# Patient Record
Sex: Female | Born: 1945 | Race: White | Hispanic: No | Marital: Married | Smoking: Never smoker
Health system: Southern US, Community
[De-identification: ages and names within clinical notes are randomized; demographics above are authoritative.]

## PROBLEM LIST (undated history)

## (undated) DIAGNOSIS — E785 Hyperlipidemia, unspecified: Secondary | ICD-10-CM

## (undated) DIAGNOSIS — Z881 Allergy status to other antibiotic agents status: Secondary | ICD-10-CM

## (undated) DIAGNOSIS — R232 Flushing: Secondary | ICD-10-CM

## (undated) HISTORY — DX: Allergy status to other antibiotic agents: Z88.1

## (undated) HISTORY — PX: RIGHT OOPHORECTOMY: SHX2359

## (undated) HISTORY — DX: Hyperlipidemia, unspecified: E78.5

## (undated) HISTORY — DX: Flushing: R23.2

## (undated) HISTORY — PX: COLONOSCOPY W/ BIOPSIES: SHX1374

## (undated) HISTORY — PX: ABDOMINAL HYSTERECTOMY: SHX81

---

## 1999-12-12 ENCOUNTER — Other Ambulatory Visit: Admission: RE | Admit: 1999-12-12 | Discharge: 1999-12-12 | Payer: Self-pay | Admitting: Obstetrics and Gynecology

## 2000-12-14 ENCOUNTER — Other Ambulatory Visit: Admission: RE | Admit: 2000-12-14 | Discharge: 2000-12-14 | Payer: Self-pay | Admitting: Obstetrics and Gynecology

## 2001-12-21 ENCOUNTER — Other Ambulatory Visit: Admission: RE | Admit: 2001-12-21 | Discharge: 2001-12-21 | Payer: Self-pay | Admitting: Obstetrics and Gynecology

## 2003-06-27 ENCOUNTER — Other Ambulatory Visit: Admission: RE | Admit: 2003-06-27 | Discharge: 2003-06-27 | Payer: Self-pay | Admitting: Family Medicine

## 2004-07-09 ENCOUNTER — Other Ambulatory Visit: Admission: RE | Admit: 2004-07-09 | Discharge: 2004-07-09 | Payer: Self-pay | Admitting: Family Medicine

## 2005-07-29 ENCOUNTER — Other Ambulatory Visit: Admission: RE | Admit: 2005-07-29 | Discharge: 2005-07-29 | Payer: Self-pay | Admitting: Family Medicine

## 2006-09-08 ENCOUNTER — Other Ambulatory Visit: Admission: RE | Admit: 2006-09-08 | Discharge: 2006-09-08 | Payer: Self-pay | Admitting: Family Medicine

## 2007-09-28 ENCOUNTER — Ambulatory Visit: Payer: Self-pay | Admitting: Internal Medicine

## 2009-04-16 ENCOUNTER — Ambulatory Visit (HOSPITAL_COMMUNITY): Admission: RE | Admit: 2009-04-16 | Discharge: 2009-04-16 | Payer: Self-pay | Admitting: Family Medicine

## 2010-05-23 ENCOUNTER — Ambulatory Visit (HOSPITAL_COMMUNITY)
Admission: RE | Admit: 2010-05-23 | Discharge: 2010-05-23 | Payer: Self-pay | Source: Home / Self Care | Attending: Family Medicine | Admitting: Family Medicine

## 2010-09-19 ENCOUNTER — Encounter: Payer: Self-pay | Admitting: Family Medicine

## 2011-07-14 ENCOUNTER — Other Ambulatory Visit (HOSPITAL_COMMUNITY): Payer: Self-pay | Admitting: *Deleted

## 2011-07-15 ENCOUNTER — Encounter (HOSPITAL_COMMUNITY)
Admission: RE | Admit: 2011-07-15 | Discharge: 2011-07-15 | Disposition: A | Discharge status: Home / Self Care | Payer: Medicare Other | Source: Ambulatory Visit | Attending: Family Medicine | Admitting: Family Medicine

## 2011-07-15 DIAGNOSIS — M810 Age-related osteoporosis without current pathological fracture: Secondary | ICD-10-CM | POA: Insufficient documentation

## 2011-07-15 MED ORDER — ZOLEDRONIC ACID 5 MG/100ML IV SOLN
5.0000 mg | Freq: Once | INTRAVENOUS | Status: AC
  Administered 2011-07-15: 5 mg via INTRAVENOUS
  Filled 2011-07-15: qty 100

## 2012-07-20 ENCOUNTER — Other Ambulatory Visit (HOSPITAL_COMMUNITY): Payer: Self-pay | Admitting: *Deleted

## 2012-07-21 ENCOUNTER — Encounter (HOSPITAL_COMMUNITY)
Admission: RE | Admit: 2012-07-21 | Discharge: 2012-07-21 | Disposition: A | Discharge status: Home / Self Care | Payer: Medicare Other | Source: Ambulatory Visit | Attending: Family Medicine | Admitting: Family Medicine

## 2012-07-21 DIAGNOSIS — M810 Age-related osteoporosis without current pathological fracture: Secondary | ICD-10-CM | POA: Insufficient documentation

## 2012-07-21 MED ORDER — ZOLEDRONIC ACID 5 MG/100ML IV SOLN
INTRAVENOUS | Status: AC
  Administered 2012-07-21: 5 mg via INTRAVENOUS
  Filled 2012-07-21: qty 100

## 2012-07-21 MED ORDER — ZOLEDRONIC ACID 5 MG/100ML IV SOLN: 5.0000 mg | Freq: Once | INTRAVENOUS | Status: AC

## 2012-08-05 ENCOUNTER — Other Ambulatory Visit: Payer: Self-pay | Admitting: *Deleted

## 2012-08-05 DIAGNOSIS — M810 Age-related osteoporosis without current pathological fracture: Secondary | ICD-10-CM

## 2012-09-20 ENCOUNTER — Telehealth: Payer: Self-pay | Admitting: Family Medicine

## 2012-09-20 NOTE — Telephone Encounter (Signed)
appt made

## 2012-09-21 ENCOUNTER — Ambulatory Visit (INDEPENDENT_AMBULATORY_CARE_PROVIDER_SITE_OTHER): Payer: Medicare Other | Admitting: Family Medicine

## 2012-09-21 ENCOUNTER — Encounter: Payer: Self-pay | Admitting: Family Medicine

## 2012-09-21 VITALS — BP 120/68 | HR 64 | Temp 97.1°F | Ht 63.75 in | Wt 117.6 lb

## 2012-09-21 DIAGNOSIS — R21 Rash and other nonspecific skin eruption: Secondary | ICD-10-CM

## 2012-09-21 MED ORDER — METHYLPREDNISOLONE ACETATE 80 MG/ML IJ SUSP
60.0000 mg | Freq: Once | INTRAMUSCULAR | Status: AC
  Administered 2012-09-21: 60 mg via INTRAMUSCULAR

## 2012-09-21 MED ORDER — PREDNISONE 10 MG PO TABS: ORAL_TABLET | ORAL | Status: DC

## 2012-09-21 NOTE — Progress Notes (Signed)
  Subjective:    Patient ID: Alexandra Davenport, female    DOB: [birth year], 67 y.o.   MRN: [id number]  HPI Patient presents with an urticarial type rash that is pruritic. This rash is predominantly located on her shoulders her abdomen above the waistline and her buttocks. She's says that it started after she used  a new detergent, Charlies Soap.   Review of Systems  Skin: Positive for rash (x 2 weeks, itchy , shoulders,waist, buttocks).       Objective:   Physical Exam There is a flat pruritic rash both shoulders, waist line above the abdomen, and posterior buttocks and upper thighs.      Assessment & Plan:  1. Rash of entire body  - predniSONE (DELTASONE) 10 MG tablet; 1 tablet 4 times a day for 2 days,  1 tablet 3 times a day for 2 days,  1 tablet 2 times a day for 2 days, 1 tablet daily for 2 days  Dispense: 20 tablet; Refill: 0  - methylPREDNISolone acetate (DEPO-MEDROL) injection 60 mg; Inject 0.75 mLs (60 mg total) into the muscle once.  Patient Instructions  Avoid fabric softeners Continue to use Dreft or [place] detergent. Take meds as directed Use Benadryl 25 mg over-the-counter in the evening and at bedtime as needed for itching Return to clinic in 2-3 weeks and we will use cryotherapy on actinic keratosis on the chest

## 2012-09-21 NOTE — Patient Instructions (Signed)
Avoid fabric softeners Continue to use Dreft or [employer] detergent. Take meds as directed Use Benadryl 25 mg over-the-counter in the evening and at bedtime as needed for itching Return to clinic in 2-3 weeks and we will use cryotherapy on actinic keratosis on the chest

## 2012-10-04 ENCOUNTER — Telehealth: Payer: Self-pay | Admitting: Family Medicine

## 2012-10-04 NOTE — Telephone Encounter (Signed)
APP MADE ° °

## 2012-10-05 ENCOUNTER — Ambulatory Visit (INDEPENDENT_AMBULATORY_CARE_PROVIDER_SITE_OTHER): Payer: Medicare Other | Admitting: Family Medicine

## 2012-10-05 ENCOUNTER — Encounter: Payer: Self-pay | Admitting: Family Medicine

## 2012-10-05 VITALS — BP 134/87 | HR 101 | Temp 97.2°F | Ht 63.75 in | Wt 114.4 lb

## 2012-10-05 DIAGNOSIS — R21 Rash and other nonspecific skin eruption: Secondary | ICD-10-CM

## 2012-10-05 DIAGNOSIS — R634 Abnormal weight loss: Secondary | ICD-10-CM

## 2012-10-05 LAB — THYROID PANEL WITH TSH
Free Thyroxine Index: 2.8 (ref 1.0–3.9)
T3 Uptake: 34 % (ref 22.5–37.0)
T4, Total: 8.2 ug/dL (ref 5.0–12.5)
TSH: 2.326 u[IU]/mL (ref 0.350–4.500)

## 2012-10-05 NOTE — Progress Notes (Signed)
  Subjective:    Patient ID: Alexandra Davenport, female    DOB: 1945/05/23, 67 y.o.   MRN: [id number]  HPI Patient continues to have rash that comes and goes. She was seen recently and treated with Depo-Medrol and prednisone. She washed and rewash all of her clothing and linen with a different detergent. She also describes a 3 pound weight loss since the last visit. She is due to get her colonoscopy in 2 days but this may have to be delayed.   Review of Systems  Skin: Positive for rash (continued).       Objective:   Physical Exam Sparse rash over her most of the body upper thighs lower abdomen, wrists, arms. The skin is dry and erythematous. There are no weeping lesions.      Assessment & Plan:  Persistent atopic dermatitis --Dr. Irene Limbo was called and she will see him this morning when her husband sees him  Weight loss -We will repeat thyroid profile today  Patient Instructions  Keep appointment with Dr. Irene Limbo Continue antihistamine Hold pravachol for 2 weeks After seeing Dr. Irene Limbo discussed the timing of colonoscopy with Dr. Kinnie Scales

## 2012-10-05 NOTE — Patient Instructions (Signed)
Keep appointment with Dr. Irene Limbo Continue antihistamine Hold pravachol for 2 weeks After seeing Dr. Irene Limbo discussed the timing of colonoscopy with Dr. Kinnie Scales

## 2012-10-05 NOTE — Addendum Note (Signed)
Addended by: Orma Render F on: 10/05/2012 09:07 AM   Modules accepted: Orders

## 2012-10-08 ENCOUNTER — Telehealth: Payer: Self-pay | Admitting: Family Medicine

## 2012-10-08 NOTE — Telephone Encounter (Signed)
Pt aware of med.

## 2012-10-08 NOTE — Telephone Encounter (Signed)
Please advise 

## 2012-10-08 NOTE — Telephone Encounter (Signed)
Try adding some Allegra over-the-counter one twice daily and continue the Benadryl at bedtime

## 2013-01-05 ENCOUNTER — Encounter: Payer: Self-pay | Admitting: Pharmacist

## 2013-01-05 ENCOUNTER — Ambulatory Visit (INDEPENDENT_AMBULATORY_CARE_PROVIDER_SITE_OTHER): Payer: Medicare Other | Admitting: Pharmacist

## 2013-01-05 ENCOUNTER — Ambulatory Visit (INDEPENDENT_AMBULATORY_CARE_PROVIDER_SITE_OTHER): Payer: Medicare Other

## 2013-01-05 ENCOUNTER — Telehealth: Payer: Self-pay | Admitting: Pharmacist

## 2013-01-05 VITALS — Ht 63.75 in | Wt 112.0 lb

## 2013-01-05 DIAGNOSIS — M810 Age-related osteoporosis without current pathological fracture: Secondary | ICD-10-CM

## 2013-01-05 NOTE — Progress Notes (Signed)
Patient ID: Alexandra Davenport, female   DOB: March 27, 1946, 67 y.o.   MRN: [id number] [facility] Current Height: Height: 5' 3.75" (161.9 cm)      Max Lifetime Height:  5\' 5"  Current Weight: Weight: 112 lb (50.803 kg)       Ethnicity:Caucasian    HPI: Does pt already have a diagnosis of:  Osteoporosis?  Yes  Back Pain?  No       Kyphosis?  No Prior fracture?  No Med(s) for Osteoporosis/Osteopenia:  reclast infused one a year - started 01/2007.  Last infusion was 07/21/2012. Med(s) previously tried for Osteoporosis/Osteopenia:  Fosamax - tolerated but treatment failed to maintain BMD.   Took Forteo for 2 years with great results.                                                             PMH: Age at menopause:  67 yo Hysterectomy?  Yes Oophorectomy?  1 ovary removed, 1 ovary remains HRT? No Steroid Use?  No Thyroid med?  No History of cancer?  No History of digestive disorders (ie Crohn's)?  No Current or previous eating disorders?  No Last Vitamin D Result:  81 (07/28/2012) Last GFR Result:  77 (07/28/2012)   FH/SH: Family history of osteoporosis?  Yes - mother, sister and aunt Parent with history of hip fracture?  No Family history of breast cancer?  No Exercise?  Yes   Smoking?  No Alcohol?  No    Calcium Assessment Calcium Intake  # of servings/day  Calcium mg  Milk (8 oz) 0  x  300  = 0  Yogurt (4 oz) 0 x  200 = 0  Cheese (1 oz) 0 x  200 = 0  Other Calcium sources   250mg   Ca supplement MVI and Calcium daily = 1000mg    Estimated calcium intake per day 1250mg     DEXA Results Date of Test T-Score for AP Spine L1-L4 T-Score for Total Left Hip T-Score for Total Right Hip  01/05/2013 -2.4 -2.5 -2.4  01/01/2011 -2.0 -2.5 -2.5  12/20/2008 -1.9 -2.5 -2.2  12/07/2006 -2.7 -2.6 -2.5   Assessment: Osteoporosis - decreased BMD in spine but stable in both hips  Recommendations: 1.  Continue Reclast IV yearly - next due 07/2013 2.  recommend calcium 1200mg  daily  through supplementation or diet.  Patient to separate MVI and Calcium supplement to ensure full absorption and benefit of each. 3.  continue weight bearing exercise - 30 minutes at least 4 days per week.   4.  Counseled and educated about fall risk and prevention.  Recheck DEXA:  2 years  Time spent counseling patient:  30 minutes

## 2013-01-05 NOTE — Telephone Encounter (Signed)
Patient aware of xray results.   

## 2013-01-05 NOTE — Telephone Encounter (Signed)
Message copied by Henrene Pastor on Wed Jan 05, 2013  1:53 PM ------      Message from: Ernestina Penna      Created: Wed Jan 05, 2013  1:36 PM       As per her report ------

## 2013-01-21 ENCOUNTER — Other Ambulatory Visit: Payer: Self-pay | Admitting: Family Medicine

## 2013-01-22 ENCOUNTER — Other Ambulatory Visit: Payer: Self-pay | Admitting: *Deleted

## 2013-01-22 MED ORDER — PRAVASTATIN SODIUM 80 MG PO TABS: 80.0000 mg | ORAL_TABLET | Freq: Every day | ORAL | Status: DC

## 2013-01-31 ENCOUNTER — Encounter: Payer: Self-pay | Admitting: Family Medicine

## 2013-01-31 ENCOUNTER — Ambulatory Visit (INDEPENDENT_AMBULATORY_CARE_PROVIDER_SITE_OTHER): Payer: Medicare Other | Admitting: Family Medicine

## 2013-01-31 VITALS — BP 106/64 | HR 67 | Temp 96.8°F | Ht 63.75 in | Wt 113.2 lb

## 2013-01-31 DIAGNOSIS — M810 Age-related osteoporosis without current pathological fracture: Secondary | ICD-10-CM

## 2013-01-31 DIAGNOSIS — E785 Hyperlipidemia, unspecified: Secondary | ICD-10-CM | POA: Insufficient documentation

## 2013-01-31 DIAGNOSIS — E559 Vitamin D deficiency, unspecified: Secondary | ICD-10-CM

## 2013-01-31 MED ORDER — PRAVASTATIN SODIUM 80 MG PO TABS: 80.0000 mg | ORAL_TABLET | Freq: Every day | ORAL | Status: DC

## 2013-01-31 NOTE — Patient Instructions (Addendum)
Fall precautions discussed Continue current meds and therapeutic lifestyle changes Return to clinic in October for a flu shot Bone density test is due Patient will be scheduled for a pelvic exam with Dr. Debroah Loop

## 2013-01-31 NOTE — Progress Notes (Signed)
  Subjective:    Patient ID: Alexandra Davenport, female    DOB: 1946/02/02, 67 y.o.   MRN: [id number]  HPI Patient comes in today for followup and management of chronic medical problems. These include lipidemia, osteoporosis, and vitamin D deficiency. She also complains of some urinary frequency and low back pain. However these complaints are unchanged from the past.  Her health maintenance prematures are up-to-date except for getting the flu shot this year. Her last pelvic exam was September 2013.    Review of Systems  Constitutional: Negative for activity change, appetite change and fatigue.  HENT: Negative for ear pain, congestion, sore throat, sneezing and postnasal drip.   Eyes: Negative.  Negative for discharge, redness, itching and visual disturbance.  Respiratory: Negative for cough, chest tightness, shortness of breath and wheezing.   Cardiovascular: Negative.  Negative for chest pain, palpitations and leg swelling.  Gastrointestinal: Negative for nausea, vomiting, abdominal pain, diarrhea, constipation and blood in stool.  Endocrine: Negative.  Negative for cold intolerance, heat intolerance, polydipsia, polyphagia and polyuria.  Genitourinary: Positive for frequency. Negative for dysuria, hematuria, vaginal bleeding and vaginal discharge.  Musculoskeletal: Positive for back pain (LBP occasional).  Skin: Positive for color change (mid upper chest, age spots). Negative for rash and wound.  Allergic/Immunologic: Negative.  Negative for environmental allergies and food allergies.  Neurological: Positive for light-headedness (intermitent). Negative for dizziness, tremors, weakness and headaches.  Psychiatric/Behavioral: Negative for confusion, sleep disturbance, decreased concentration and agitation. The patient is not nervous/anxious.        Objective:   Physical Exam BP 106/64  Pulse 67  Temp(Src) 96.8 F (36 C) (Oral)  Ht 5' 3.75" (1.619 m)  Wt 113 lb 3.2 oz (51.347 kg)  BMI 19.59  kg/m2  The patient appeared well nourished and normally developed, alert and oriented to time and place. Speech, behavior and judgement appear normal. Vital signs as documented.  Head exam is unremarkable. No scleral icterus or pallor noted. There is some nasal congestion bilaterally. Mouth and throat and ears were normal.  Neck is without jugular venous distension, thyromegally, or carotid bruits. Carotid upstrokes are brisk bilaterally. No cervical adenopathy. Lungs are clear anteriorly and posteriorly to auscultation. Normal respiratory effort. Cardiac exam reveals regular rate and rhythm at 60 per minute. First and second heart sounds normal.  No murmurs, rubs or gallops.  Abdominal exam reveals normal bowl sounds, no masses, no organomegaly and no aortic enlargement. No inguinal adenopathy. Extremities are nonedematous and both femoral and pedal pulses are normal. Skin without pallor or jaundice.  Warm and dry, without rash. There were many seborrheic keratoses these were flat and did not look suspicious in nature. Neurologic exam reveals normal deep tendon reflexes and normal sensation.          Assessment & Plan:  1. Hyperlipemia -Patient will return to clinic for retained lab work included lipids  2. Osteoporosis -DEXA scan is due now  3. Vitamin D deficiency -Continue current treatment  4. Pelvic exam is due  Patient to return to clinic for lab work Patient Instructions  Fall precautions discussed Continue current meds and therapeutic lifestyle changes Return to clinic in October for a flu shot Bone density test is due Patient will be scheduled for a pelvic exam with Dr. Christian Mate MD

## 2013-02-02 ENCOUNTER — Other Ambulatory Visit: Payer: Self-pay | Admitting: *Deleted

## 2013-02-02 DIAGNOSIS — Z01419 Encounter for gynecological examination (general) (routine) without abnormal findings: Secondary | ICD-10-CM

## 2013-02-17 ENCOUNTER — Other Ambulatory Visit (INDEPENDENT_AMBULATORY_CARE_PROVIDER_SITE_OTHER): Payer: Medicare Other

## 2013-02-17 DIAGNOSIS — Z23 Encounter for immunization: Secondary | ICD-10-CM

## 2013-02-17 DIAGNOSIS — E559 Vitamin D deficiency, unspecified: Secondary | ICD-10-CM

## 2013-02-17 DIAGNOSIS — E785 Hyperlipidemia, unspecified: Secondary | ICD-10-CM

## 2013-02-17 DIAGNOSIS — I10 Essential (primary) hypertension: Secondary | ICD-10-CM

## 2013-02-17 LAB — POCT CBC
Lymph, poc: 1.4 (ref 0.6–3.4)
MCH, POC: 29.6 pg (ref 27–31.2)
MCHC: 33.2 g/dL (ref 31.8–35.4)
MPV: 8.5 fL (ref 0–99.8)
POC LYMPH PERCENT: 33.1 %L (ref 10–50)
Platelet Count, POC: 172 10*3/uL (ref 142–424)
WBC: 4.3 10*3/uL — AB (ref 4.6–10.2)

## 2013-02-17 NOTE — Progress Notes (Unsigned)
Pt came in for labs only 

## 2013-02-19 LAB — NMR, LIPOPROFILE
HDL Cholesterol by NMR: 68 mg/dL (ref >=40)
LP-IR Score: <25 (ref <=45)
Small LDL Particle Number: <90 nmol/L (ref <=527)

## 2013-02-19 LAB — VITAMIN D 25 HYDROXY (VIT D DEFICIENCY, FRACTURES): Vit D, 25-Hydroxy: 67.5 ng/mL (ref 30.0–100.0)

## 2013-02-19 LAB — BMP8+EGFR
Calcium: 9.7 mg/dL (ref 8.6–10.2)
Creatinine, Ser: 0.73 mg/dL (ref 0.57–1.00)
GFR calc non Af Amer: 85 mL/min/{1.73_m2} (ref >59)
Glucose: 76 mg/dL (ref 65–99)
Potassium: 4.2 mmol/L (ref 3.5–5.2)

## 2013-02-19 LAB — HEPATIC FUNCTION PANEL
ALT: 27 IU/L (ref 0–32)
Bilirubin, Direct: 0.15 mg/dL (ref 0.00–0.40)

## 2013-03-01 ENCOUNTER — Encounter: Payer: Self-pay | Admitting: Obstetrics & Gynecology

## 2013-03-01 ENCOUNTER — Ambulatory Visit (INDEPENDENT_AMBULATORY_CARE_PROVIDER_SITE_OTHER): Payer: Medicare Other | Admitting: Obstetrics & Gynecology

## 2013-03-01 VITALS — BP 119/75 | HR 73 | Resp 16 | Ht 64.0 in | Wt 113.0 lb

## 2013-03-01 DIAGNOSIS — Z01419 Encounter for gynecological examination (general) (routine) without abnormal findings: Secondary | ICD-10-CM | POA: Insufficient documentation

## 2013-03-01 NOTE — Patient Instructions (Signed)
Breast Self-Examination You should begin examining your breasts at age 68 even though the risk for breast cancer is low in this age group. It is important to become familiar with how your breasts look and feel. This is true for pregnant women, nursing mothers, women in menopause and women who have breast implants.  Women should examine their breasts once a month to look for changes and lumps. By doing monthly breast exams, you get to know how your breasts feel and how they can change from month to month. This allows you to pick up changes early. It can also offer you some reassurance that your breast health is good. This exam only takes minutes. Most breast lumps are not caused by cancer. If you find a lump, a special x-ray called a mammogram, or other tests may be needed to determine what is wrong.  Some of the signs that a breast lump is caused by cancer include:  Dimpling of the skin or changes in the shape of the breast or nipple.   A dark-colored or bloody discharge from the nipple.   Swollen lymph glands around the breast or in the armpit.   Redness of the breast or nipple.   Scaly nipple or skin on the breast.   Pain or swelling of the breast.  SELF-EXAM There are a few points to follow when doing a thorough breast exam. The best time to examine your breasts is 5 to 7 days after the menstrual period is over. During menstruation, the breasts are lumpier, and it may be more difficult to pick up changes. If you do not menstruate, have reached menopause or had a hysterectomy, examine your breasts the first day of every month. After three to four months, you will become more familiar with the variations of your breasts and more comfortable with the exam.  Perform your breast exam monthly. Keep a written record with breast changes or normal findings for each breast. This makes it easier to be sure of changes and to not solely depend on memory for size, tenderness, or location. Try to do the exam  at the same time each month, and write down where you are in your menstrual cycle if you are still menstruating.   Look at your breasts. Stand in front of a mirror with your hands clasped behind your head. Tighten your chest muscles and look for asymmetry. This means a difference in shape or contour from one breast to the other, such as puckers, dips or bumps. Look also for skin changes.   Lean forward with your hands on your hips. Again, look for symmetry and skin changes.   While showering, soap the breasts, and carefully feel the breasts with fingertips while holding the arm (on the side of the breast being examined) over the head. Do this with each breast carefully feeling for lumps or changes. Typically, a circular motion with moderate fingertip pressure should be used.   Repeat this exam while lying on your back, again with your arm behind your head and a pillow under your shoulders. Again, use your fingertips to examine both breasts, feeling for lumps and thickening. Begin at 1 o'clock and go clockwise around the whole breast.   At the end of your exam, gently squeeze each nipple to see if there is any drainage. Look for nipple changes, dimpling or redness.   Lastly, examine the upper chest and clavicle areas and in your armpits.  It is not necessary to become alarmed if you find   a breast lump. Most of them are not cancerous. However, it is necessary to see your caregiver if a lump is found in order to have it looked at. Document Released: 06/12/2004 Document Revised: 01/15/2011 Document Reviewed: 08/22/2008 Exit[employer] Patient Information 2012 [employer]. 

## 2013-03-01 NOTE — Progress Notes (Signed)
Patient ID: Alexandra Davenport, female   DOB: 1946/02/15, 67 y.o.   MRN: [id number]  Chief Complaint  Patient presents with  . Gynecologic Exam    HPI Alexandra Davenport is a 67 y.o. female.  [id number] S/P TAH for class 3 pap age 54, normal paps since then. Later had a right oophorectomy. Referred for Gyn exam. No pelvic complaints, sees a urologist yearly HPI  Past Medical History  Diagnosis Date  . Hemorrhoids   . Osteoporosis   . 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 Endometriosis   . Hyperlipidemia   . Vasomotor flushing     Past Surgical History  Procedure Laterality Date  . Abdominal hysterectomy N/A   . Right oophorectomy Right   . Colonoscopy w/ biopsies N/A     No family history on file.  Social History History  Substance Use Topics  . Smoking status: Never Smoker   . Smokeless tobacco: Not on file  . Alcohol Use: Not on file    Allergies  Allergen Reactions  . Ezetimibe-Simvastatin Other (See Comments)    Muscle soreness    Current Outpatient Prescriptions  Medication Sig Dispense Refill  . aspirin (ASPIRIN LOW DOSE) 81 MG EC tablet Take 81 mg by mouth daily.        . Calcium Carbonate-Vitamin D (CALCIUM + D PO) Take 1 tablet by mouth daily.       . Cholecalciferol (VITAMIN D3) 1000 UNITS CAPS Take 1 capsule by mouth every other day.       . Coenzyme Q10 (CO Q-10) 200 MG CAPS Take by mouth daily.        . fish oil-omega-3 fatty acids 1000 MG capsule Take 2 g by mouth daily.      . Multiple Vitamin (MULTIVITAMIN) capsule Take 1 capsule by mouth daily.        Marland Kitchen oxybutynin (DITROPAN) 5 MG tablet Take 5 mg by mouth.        . pravastatin (PRAVACHOL) 80 MG tablet Take 1 tablet (80 mg total) by mouth daily.  90 tablet  3  . TRIMETHOPRIM PO Take 1 tablet by mouth daily as needed. Dr. Annabell Howells      . zoledronic acid (RECLAST) 5 MG/100ML SOLN injection Inject 5 mg into the vein once.       No current facility-administered medications for this visit.    Review of Systems Review of Systems   Constitutional: Negative for fever.  Gastrointestinal: Negative for abdominal pain.  Genitourinary: Positive for frequency. Negative for vaginal bleeding, vaginal discharge, pelvic pain and dyspareunia.    Blood pressure 119/75, pulse 73, resp. rate 16, height 5\' 4"  (1.626 m), weight 113 lb (51.256 kg).  Physical Exam Physical Exam  Nursing note and vitals reviewed. Constitutional: She is oriented to person, place, and time. She appears well-developed and well-nourished. No distress.  HENT:  Head: Normocephalic.  Pulmonary/Chest: Effort normal. No respiratory distress.  Breasts no mass or tenderness  Abdominal: Soft. She exhibits no mass. There is no tenderness.  Genitourinary:  Deferred, no indication today.  Neurological: She is alert and oriented to person, place, and time.  Skin: Skin is warm and dry.  Psychiatric: She has a normal mood and affect. Her behavior is normal.    Data Reviewed Office note  Assessment    Normal postmenopause female, age 19, S/P TAH      Plan    No need for routine pelvic exam, should have yearly breast exam,   Mammogram, colonoscopy on appropriate schedule.  Alexandra Davenport 03/01/2013, 10:58 AM

## 2013-07-04 ENCOUNTER — Telehealth: Payer: Self-pay | Admitting: Pharmacist

## 2013-07-04 DIAGNOSIS — Z79899 Other long term (current) drug therapy: Secondary | ICD-10-CM

## 2013-07-04 DIAGNOSIS — M810 Age-related osteoporosis without current pathological fracture: Secondary | ICD-10-CM

## 2013-07-04 NOTE — Telephone Encounter (Signed)
Yearly reclast infusion is due at beginning of March.  Will need labs prior - BMET.  Lab standing order made and patient notified to come in for labs in the next 2 weeks. After labs received can then schedule [facility].

## 2013-07-13 ENCOUNTER — Other Ambulatory Visit (INDEPENDENT_AMBULATORY_CARE_PROVIDER_SITE_OTHER): Payer: Medicare Other

## 2013-07-13 DIAGNOSIS — M810 Age-related osteoporosis without current pathological fracture: Secondary | ICD-10-CM

## 2013-07-13 DIAGNOSIS — Z79899 Other long term (current) drug therapy: Secondary | ICD-10-CM

## 2013-07-13 NOTE — Progress Notes (Signed)
PT CAME IN FOR LABS ONLY 

## 2013-07-14 ENCOUNTER — Encounter: Payer: Self-pay | Admitting: Pharmacist

## 2013-07-14 LAB — BMP8+EGFR
BUN/Creatinine Ratio: 25 (ref 11–26)
BUN: 17 mg/dL (ref 8–27)
CALCIUM: 9.8 mg/dL (ref 8.7–10.3)
CHLORIDE: 103 mmol/L (ref 97–108)
CO2: 27 mmol/L (ref 18–29)
Creatinine, Ser: 0.69 mg/dL (ref 0.57–1.00)
GFR calc Af Amer: 103 mL/min/{1.73_m2} (ref >59)
GFR calc non Af Amer: 90 mL/min/{1.73_m2} (ref >59)
GLUCOSE: 70 mg/dL (ref 65–99)
Potassium: 4 mmol/L (ref 3.5–5.2)
Sodium: 144 mmol/L (ref 134–144)

## 2013-07-26 ENCOUNTER — Other Ambulatory Visit (HOSPITAL_COMMUNITY): Payer: Self-pay | Admitting: *Deleted

## 2013-07-27 ENCOUNTER — Encounter (HOSPITAL_COMMUNITY)
Admission: RE | Admit: 2013-07-27 | Discharge: 2013-07-27 | Disposition: A | Discharge status: Home / Self Care | Payer: Medicare Other | Source: Ambulatory Visit | Attending: Family Medicine | Admitting: Family Medicine

## 2013-07-27 DIAGNOSIS — M810 Age-related osteoporosis without current pathological fracture: Secondary | ICD-10-CM | POA: Insufficient documentation

## 2013-07-27 MED ORDER — ZOLEDRONIC ACID 5 MG/100ML IV SOLN
INTRAVENOUS | Status: AC
  Administered 2013-07-27: 5 mg
  Filled 2013-07-27: qty 100

## 2013-07-27 MED ORDER — ZOLEDRONIC ACID 5 MG/100ML IV SOLN: 5.0000 mg | Freq: Once | INTRAVENOUS | Status: DC

## 2013-08-03 ENCOUNTER — Encounter: Payer: Self-pay | Admitting: Family Medicine

## 2013-08-03 ENCOUNTER — Ambulatory Visit (INDEPENDENT_AMBULATORY_CARE_PROVIDER_SITE_OTHER): Payer: Medicare Other

## 2013-08-03 ENCOUNTER — Ambulatory Visit (INDEPENDENT_AMBULATORY_CARE_PROVIDER_SITE_OTHER): Payer: Medicare Other | Admitting: Family Medicine

## 2013-08-03 VITALS — BP 132/85 | HR 74 | Temp 96.7°F | Ht 64.0 in | Wt 110.0 lb

## 2013-08-03 DIAGNOSIS — Z0000 Encounter for general adult medical examination without abnormal findings: Secondary | ICD-10-CM

## 2013-08-03 DIAGNOSIS — E785 Hyperlipidemia, unspecified: Secondary | ICD-10-CM

## 2013-08-03 DIAGNOSIS — E559 Vitamin D deficiency, unspecified: Secondary | ICD-10-CM

## 2013-08-03 DIAGNOSIS — Z23 Encounter for immunization: Secondary | ICD-10-CM

## 2013-08-03 DIAGNOSIS — R634 Abnormal weight loss: Secondary | ICD-10-CM

## 2013-08-03 DIAGNOSIS — N390 Urinary tract infection, site not specified: Secondary | ICD-10-CM

## 2013-08-03 DIAGNOSIS — R300 Dysuria: Secondary | ICD-10-CM

## 2013-08-03 LAB — POCT URINALYSIS DIPSTICK
BILIRUBIN UA: NEGATIVE
Glucose, UA: NEGATIVE
Ketones, UA: NEGATIVE
NITRITE UA: NEGATIVE
PH UA: 5
Spec Grav, UA: 1.015
Urobilinogen, UA: NEGATIVE

## 2013-08-03 LAB — POCT UA - MICROSCOPIC ONLY
CASTS, UR, LPF, POC: NEGATIVE
CRYSTALS, UR, HPF, POC: NEGATIVE
MUCUS UA: NEGATIVE
Yeast, UA: NEGATIVE

## 2013-08-03 LAB — POCT CBC
Granulocyte percent: 62.2 %G (ref 37–80)
HCT, POC: 43.6 % (ref 37.7–47.9)
Hemoglobin: 13.9 g/dL (ref 12.2–16.2)
LYMPH, POC: 1.6 (ref 0.6–3.4)
MCH: 28.9 pg (ref 27–31.2)
MCHC: 32 g/dL (ref 31.8–35.4)
MCV: 90.3 fL (ref 80–97)
MPV: 8.8 fL (ref 0–99.8)
PLATELET COUNT, POC: 175 10*3/uL (ref 142–424)
POC Granulocyte: 3.1 (ref 2–6.9)
POC LYMPH PERCENT: 32.5 %L (ref 10–50)
RBC: 4.8 M/uL (ref 4.04–5.48)
RDW, POC: 13.2 %
WBC: 5 10*3/uL (ref 4.6–10.2)

## 2013-08-03 LAB — POCT GLYCOSYLATED HEMOGLOBIN (HGB A1C): Hemoglobin A1C: 5.3

## 2013-08-03 MED ORDER — NITROFURANTOIN MACROCRYSTAL 100 MG PO CAPS: 100.0000 mg | ORAL_CAPSULE | Freq: Every day | ORAL | Status: DC

## 2013-08-03 NOTE — Patient Instructions (Addendum)
Medicare Annual Wellness Visit  [place] and the medical providers at [facility] strive to bring you the best medical care.  In doing so we not only want to address your current medical conditions and concerns but also to detect new conditions early and prevent illness, disease and health-related problems.    Medicare offers a yearly Wellness Visit which allows our clinical staff to assess your need for preventative services including immunizations, lifestyle education, counseling to decrease risk of preventable diseases and screening for fall risk and other medical concerns.    This visit is provided free of charge (no copay) for all Medicare recipients. The clinical pharmacists at [facility] have begun to conduct these Wellness Visits which will also include a thorough review of all your medications.    As you primary medical provider recommend that you make an appointment for your Annual Wellness Visit if you have not done so already this year.  You may set up this appointment before you leave today or you may call back [phone] and schedule an appointment.  Please make sure when you call that you mention that you are scheduling your Annual Wellness Visit with the clinical pharmacist so that the appointment may be made for the proper length of time.     Continue current medications. Continue good therapeutic lifestyle changes which include good diet and exercise. Fall precautions discussed with patient. If an FOBT was given today- please return it to our front desk. If you are over 69 years old - you may need Prevnar 13 or the adult Pneumonia vaccine.  We will call you with the results of a chest x-ray FOBT and lab work is as those results are available.  The Prevnar vaccine that you received today may make your arm is sore  continue the Macrodantin until you see the urologist  we will recheck you in about 6 weeks to  followup on the weight loss issues if you continue to have weight loss we will look further at possible reasons for this.

## 2013-08-03 NOTE — Addendum Note (Signed)
Addended by: Prescott GumLAND, Hillery Zachman M on: 08/03/2013 03:39 PM   Modules accepted: Orders

## 2013-08-03 NOTE — Progress Notes (Signed)
Subjective:    Patient ID: Alexandra Davenport, female    DOB: [birth year], 68 y.o.   MRN: [id number]  HPI Pt here for follow up and management of chronic medical problems. The patient does complain today of some bladder problems with a history of cystitis. She is due to get a chest x-ray. She will be given FOBT to return. She will get her routine lab work because of cholesterol. She'll also receive her Prevnar vaccine today. Otherwise, her healthcare parameters seem to be up-to-date . The patient has some concerns regarding weight loss. She indicates she has a good appetite. She has not been exercising a lot because of the cold weather. There is a family history of diabetes. She she does go to the bathroom a lot passing her water but she feels like this is secondary to her chronic cystitis which is followed by the urologist. She drinks a lot of water but it doesn't sound like they probably did see history. She is due to get her Prevnar vaccine today.     Patient Active Problem List   Diagnosis Date Noted  . Routine gynecological examination 03/01/2013  . Hyperlipemia 01/31/2013  . Vitamin D deficiency 01/31/2013  . Osteoporosis 01/05/2013   Outpatient Encounter Prescriptions as of 08/03/2013  Medication Sig  . aspirin (ASPIRIN LOW DOSE) 81 MG EC tablet Take 81 mg by mouth daily.    . Calcium Carbonate-Vitamin D (CALCIUM + D PO) Take 1 tablet by mouth daily.   . Cholecalciferol (VITAMIN D3) 1000 UNITS CAPS Take 1 capsule by mouth every other day.   . Coenzyme Q10 (CO Q-10) 200 MG CAPS Take by mouth daily.    . fish oil-omega-3 fatty acids 1000 MG capsule Take 2 g by mouth daily.  . Multiple Vitamin (MULTIVITAMIN) capsule Take 1 capsule by mouth daily.    Marland Kitchen oxybutynin (DITROPAN) 5 MG tablet Take 5 mg by mouth.   . pravastatin (PRAVACHOL) 80 MG tablet Take 1 tablet (80 mg total) by mouth daily.  Marland Kitchen TRIMETHOPRIM PO Take 1 tablet by mouth daily as needed. Dr. Jeffie Pollock  . zoledronic acid (RECLAST) 5  MG/100ML SOLN injection Inject 5 mg into the vein once.    Review of Systems  Constitutional: Negative.   HENT: Negative.   Eyes: Negative.   Respiratory: Negative.   Cardiovascular: Negative.   Gastrointestinal: Negative.   Endocrine: Negative.   Genitourinary: Negative.        Cystitis  Musculoskeletal: Negative.   Skin: Negative.   Allergic/Immunologic: Negative.   Neurological: Negative.   Hematological: Negative.   Psychiatric/Behavioral: Negative.        Objective:   Physical Exam  Nursing note and vitals reviewed. Constitutional: She is oriented to person, place, and time. She appears well-developed and well-nourished. No distress.  HENT:  Right Ear: External ear normal.  Left Ear: External ear normal.  Nose: Nose normal.  Mouth/Throat: Oropharynx is clear and moist. No oropharyngeal exudate.  Eyes: Conjunctivae and EOM are normal. Pupils are equal, round, and reactive to light. Right eye exhibits no discharge. Left eye exhibits no discharge. No scleral icterus.  Neck: Normal range of motion. Neck supple. No thyromegaly present.  Cardiovascular: Normal rate, regular rhythm, normal heart sounds and intact distal pulses.  Exam reveals no gallop and no friction rub.   No murmur heard. At 72 per minute  Pulmonary/Chest: Effort normal and breath sounds normal. No respiratory distress. She has no wheezes. She has no rales. She exhibits no tenderness.  Breath sounds are normal bilaterally and there were no axillary nodes.  Abdominal: Soft. Bowel sounds are normal. She exhibits no mass. There is no tenderness. There is no rebound and no guarding.  There is no abdominal tenderness or inguinal nodes or masses.  Musculoskeletal: Normal range of motion. She exhibits no edema and no tenderness.  Lymphadenopathy:    She has no cervical adenopathy.  Neurological: She is alert and oriented to person, place, and time. She has normal reflexes. No cranial nerve deficit.  Skin: Skin is  warm and dry. No rash noted. No erythema. No pallor.  Psychiatric: She has a normal mood and affect. Her behavior is normal. Judgment and thought content normal.   BP 132/85  Pulse 74  Ht 5' 4"  (1.626 m)  Wt 110 lb (49.896 kg)  BMI 18.87 kg/m2  [facility] reading (PRIMARY) by  Dr. Brunilda Payor x-ray- no active disease                                      Assessment & Plan:  1. Hyperlipemia - POCT CBC - BMP8+EGFR - Hepatic function panel - NMR, lipoprofile  2. Vitamin D deficiency - POCT CBC - Vit D  25 hydroxy (rtn osteoporosis monitoring)  3. Dysuria - POCT CBC - BMP8+EGFR - POCT urinalysis dipstick - POCT UA - Microscopic Only  4. Health care maintenance -Prevnar vaccine - DG Chest 2 View; Future - POCT glycosylated hemoglobin (Hb A1C) - Thyroid Panel With TSH  5. Loss of weight - POCT glycosylated hemoglobin (Hb A1C) - Thyroid Panel With TSH -Recheck in 6 weeks  Patient Instructions                       Medicare Annual Wellness Visit  [facility] and the medical providers at [facility] strive to bring you the best medical care.  In doing so we not only want to address your current medical conditions and concerns but also to detect new conditions early and prevent illness, disease and health-related problems.    Medicare offers a yearly Wellness Visit which allows our clinical staff to assess your need for preventative services including immunizations, lifestyle education, counseling to decrease risk of preventable diseases and screening for fall risk and other medical concerns.    This visit is provided free of charge (no copay) for all Medicare recipients. The clinical pharmacists at [facility] have begun to conduct these Wellness Visits which will also include a thorough review of all your medications.    As you primary medical provider recommend that you make an appointment for your Annual Wellness Visit if you have  not done so already this year.  You may set up this appointment before you leave today or you may call back ([phone]) and schedule an appointment.  Please make sure when you call that you mention that you are scheduling your Annual Wellness Visit with the clinical pharmacist so that the appointment may be made for the proper length of time.     Continue current medications. Continue good therapeutic lifestyle changes which include good diet and exercise. Fall precautions discussed with patient. If an FOBT was given today- please return it to our front desk. If you are over 30 years old - you may need Prevnar 44 or the adult Pneumonia vaccine.  We will call you with the results of a  chest x-ray FOBT and lab work is as those results are available.  The Prevnar vaccine that you received today may make your arm is sore  continue the Macrodantin until you see the urologist  we will recheck you in about 6 weeks to followup on the weight loss issues if you continue to have weight loss we will look further at possible reasons for this.       Arrie Senate MD

## 2013-08-03 NOTE — Addendum Note (Signed)
Addended by: Magdalene RiverBULLINS, Rosellen Lichtenberger H on: 08/03/2013 04:23 PM   Modules accepted: Orders

## 2013-08-05 ENCOUNTER — Other Ambulatory Visit: Payer: Self-pay | Admitting: Nurse Practitioner

## 2013-08-05 LAB — THYROID PANEL WITH TSH
Free Thyroxine Index: 1.8 (ref 1.2–4.9)
T3 Uptake Ratio: 25 % (ref 24–39)
T4, Total: 7.1 ug/dL (ref 4.5–12.0)
TSH: 2.23 u[IU]/mL (ref 0.450–4.500)

## 2013-08-05 LAB — HEPATIC FUNCTION PANEL
ALT: 29 IU/L (ref 0–32)
AST: 31 IU/L (ref 0–40)
Albumin: 4.6 g/dL (ref 3.6–4.8)
Alkaline Phosphatase: 53 IU/L (ref 39–117)
BILIRUBIN TOTAL: 0.4 mg/dL (ref 0.0–1.2)
Bilirubin, Direct: 0.11 mg/dL (ref 0.00–0.40)
Total Protein: 7.1 g/dL (ref 6.0–8.5)

## 2013-08-05 LAB — URINE CULTURE

## 2013-08-05 LAB — BMP8+EGFR
BUN / CREAT RATIO: 25 (ref 11–26)
BUN: 19 mg/dL (ref 8–27)
CO2: 27 mmol/L (ref 18–29)
CREATININE: 0.76 mg/dL (ref 0.57–1.00)
Calcium: 10.1 mg/dL (ref 8.7–10.3)
Chloride: 102 mmol/L (ref 97–108)
GFR calc Af Amer: 93 mL/min/{1.73_m2} (ref >59)
GFR calc non Af Amer: 81 mL/min/{1.73_m2} (ref >59)
Glucose: 87 mg/dL (ref 65–99)
Potassium: 5.5 mmol/L — ABNORMAL HIGH (ref 3.5–5.2)
SODIUM: 143 mmol/L (ref 134–144)

## 2013-08-05 LAB — NMR, LIPOPROFILE
Cholesterol: 157 mg/dL (ref <200)
HDL CHOLESTEROL BY NMR: 76 mg/dL (ref >=40)
HDL Particle Number: 38.7 umol/L (ref >=30.5)
LDL Particle Number: 658 nmol/L (ref <1000)
LDL SIZE: 20.5 nm — AB (ref >20.5)
LDLC SERPL CALC-MCNC: 64 mg/dL (ref <100)
LP-IR Score: <25 (ref <=45)
SMALL LDL PARTICLE NUMBER: 337 nmol/L (ref <=527)
Triglycerides by NMR: 85 mg/dL (ref <150)

## 2013-08-05 LAB — VITAMIN D 25 HYDROXY (VIT D DEFICIENCY, FRACTURES): Vit D, 25-Hydroxy: 65.3 ng/mL (ref 30.0–100.0)

## 2013-08-05 MED ORDER — CIPROFLOXACIN HCL 500 MG PO TABS: 500.0000 mg | ORAL_TABLET | Freq: Two times a day (BID) | ORAL | Status: DC

## 2013-08-23 ENCOUNTER — Other Ambulatory Visit: Payer: Medicare Other

## 2013-08-23 DIAGNOSIS — Z1212 Encounter for screening for malignant neoplasm of rectum: Secondary | ICD-10-CM

## 2013-08-23 NOTE — Progress Notes (Signed)
Pt dropped off FOBT  

## 2013-08-24 LAB — FECAL OCCULT BLOOD, IMMUNOCHEMICAL: FECAL OCCULT BLD: NEGATIVE

## 2013-08-25 ENCOUNTER — Encounter: Payer: Self-pay | Admitting: *Deleted

## 2013-08-25 NOTE — Progress Notes (Signed)
Quick Note:  Copy of labs sent to patient ______ 

## 2013-09-05 ENCOUNTER — Telehealth: Payer: Self-pay | Admitting: Family Medicine

## 2013-09-05 NOTE — Telephone Encounter (Signed)
Patient just wanted to reschedule appt so i did that for her.

## 2013-09-15 ENCOUNTER — Ambulatory Visit: Payer: Medicare Other | Admitting: Family Medicine

## 2014-01-11 ENCOUNTER — Other Ambulatory Visit: Payer: Self-pay | Admitting: Family Medicine

## 2014-01-13 NOTE — Telephone Encounter (Signed)
Last seen 08/03/13 DW[facility]  This med not on EPIC list

## 2014-02-06 ENCOUNTER — Encounter: Payer: Self-pay | Admitting: Family Medicine

## 2014-02-06 ENCOUNTER — Ambulatory Visit (INDEPENDENT_AMBULATORY_CARE_PROVIDER_SITE_OTHER): Payer: Medicare Other | Admitting: Family Medicine

## 2014-02-06 VITALS — BP 116/69 | HR 73 | Temp 96.6°F | Ht 64.0 in | Wt 108.0 lb

## 2014-02-06 DIAGNOSIS — J301 Allergic rhinitis due to pollen: Secondary | ICD-10-CM

## 2014-02-06 DIAGNOSIS — E559 Vitamin D deficiency, unspecified: Secondary | ICD-10-CM

## 2014-02-06 DIAGNOSIS — J069 Acute upper respiratory infection, unspecified: Secondary | ICD-10-CM

## 2014-02-06 DIAGNOSIS — E785 Hyperlipidemia, unspecified: Secondary | ICD-10-CM

## 2014-02-06 DIAGNOSIS — R05 Cough: Secondary | ICD-10-CM

## 2014-02-06 DIAGNOSIS — R059 Cough, unspecified: Secondary | ICD-10-CM

## 2014-02-06 LAB — POCT CBC
GRANULOCYTE PERCENT: 77.4 % (ref 37–80)
HEMATOCRIT: 40.8 % (ref 37.7–47.9)
Hemoglobin: 13.9 g/dL (ref 12.2–16.2)
LYMPH, POC: 1.2 (ref 0.6–3.4)
MCH, POC: 30.5 pg (ref 27–31.2)
MCHC: 34 g/dL (ref 31.8–35.4)
MCV: 89.7 fL (ref 80–97)
MPV: 9.3 fL (ref 0–99.8)
PLATELET COUNT, POC: 187 10*3/uL (ref 142–424)
POC Granulocyte: 4.7 (ref 2–6.9)
POC LYMPH %: 19.1 % (ref 10–50)
RBC: 4.6 M/uL (ref 4.04–5.48)
RDW, POC: 12.7 %
WBC: 6.1 10*3/uL (ref 4.6–10.2)

## 2014-02-06 MED ORDER — PRAVASTATIN SODIUM 40 MG PO TABS: 40.0000 mg | ORAL_TABLET | Freq: Every day | ORAL | Status: AC

## 2014-02-06 MED ORDER — HYDROCOD POLST-CHLORPHEN POLST 10-8 MG/5ML PO LQCR: ORAL | Status: AC

## 2014-02-06 NOTE — Progress Notes (Signed)
Subjective:    Patient ID: Alexandra Davenport, female    DOB: January 13, 1946, 68 y.o.   MRN: [id number]  HPI  Pt here for follow up and management of chronic medical problems. The patient complains today of some sinus congestion and headache. She is also due to get her lab work. She also requests a refill on her statin drug. The patient denies fever or or excessive yellow drainage.       Patient Active Problem List   Diagnosis Date Noted  . Routine gynecological examination 03/01/2013  . Hyperlipemia 01/31/2013  . Vitamin D deficiency 01/31/2013  . Osteoporosis 01/05/2013   Outpatient Encounter Prescriptions as of 02/06/2014  Medication Sig  . aspirin (ASPIRIN LOW DOSE) 81 MG EC tablet Take 81 mg by mouth daily.    . Calcium Carbonate-Vitamin D (CALCIUM + D PO) Take 1 tablet by mouth daily.   . Cholecalciferol (VITAMIN D3) 1000 UNITS CAPS Take 1 capsule by mouth every other day.   . Coenzyme Q10 (CO Q-10) 200 MG CAPS Take by mouth daily.    . fish oil-omega-3 fatty acids 1000 MG capsule Take 2 g by mouth daily.  . Multiple Vitamin (MULTIVITAMIN) capsule Take 1 capsule by mouth daily.    . mupirocin ointment (BACTROBAN) 2 % USE AS DIRECTED  . oxybutynin (DITROPAN) 5 MG tablet Take 5 mg by mouth.   . zoledronic acid (RECLAST) 5 MG/100ML SOLN injection Inject 5 mg into the vein once.  . [DISCONTINUED] pravastatin (PRAVACHOL) 80 MG tablet Take 1 tablet (80 mg total) by mouth daily.  . pravastatin (PRAVACHOL) 40 MG tablet Take 1 tablet (40 mg total) by mouth daily.  . [DISCONTINUED] ciprofloxacin (CIPRO) 500 MG tablet Take 1 tablet (500 mg total) by mouth 2 (two) times daily.  . [DISCONTINUED] nitrofurantoin (MACRODANTIN) 100 MG capsule Take 1 capsule (100 mg total) by mouth at bedtime.    Review of Systems  Constitutional: Negative.   HENT: Positive for sinus pressure.   Eyes: Negative.   Respiratory: Negative.   Cardiovascular: Negative.   Gastrointestinal: Negative.   Endocrine:  Negative.   Genitourinary: Negative.   Musculoskeletal: Negative.   Skin: Negative.   Allergic/Immunologic: Negative.   Neurological: Positive for headaches.  Hematological: Negative.   Psychiatric/Behavioral: Negative.        Objective:   Physical Exam  Nursing note and vitals reviewed. Constitutional: She is oriented to person, place, and time. She appears well-developed and well-nourished. No distress.  HENT:  Head: Normocephalic and atraumatic.  Right Ear: External ear normal.  Left Ear: External ear normal.  Mouth/Throat: Oropharynx is clear and moist.  Mild nasal congestion bilaterally  Eyes: Conjunctivae and EOM are normal. Pupils are equal, round, and reactive to light. Right eye exhibits no discharge. Left eye exhibits no discharge. No scleral icterus.  Neck: Normal range of motion. Neck supple. No thyromegaly present.  Cardiovascular: Normal rate, regular rhythm, normal heart sounds and intact distal pulses.  Exam reveals no gallop and no friction rub.   No murmur heard. At 72 per minute  Pulmonary/Chest: Effort normal and breath sounds normal. No respiratory distress. She has no wheezes. She has no rales. She exhibits no tenderness.  Minimally congested cough  Abdominal: Soft. Bowel sounds are normal. She exhibits no mass. There is no tenderness. There is no rebound and no guarding.   No abdominal bruits  Musculoskeletal: Normal range of motion. She exhibits no edema and no tenderness.  Lymphadenopathy:    She has no cervical  adenopathy.  Neurological: She is alert and oriented to person, place, and time. She has normal reflexes. No cranial nerve deficit.  Skin: Skin is warm and dry. No rash noted.  Psychiatric: She has a normal mood and affect. Her behavior is normal. Judgment and thought content normal.   BP 116/69  Pulse 73  Temp(Src) 96.6 F (35.9 C) (Oral)  Ht 5' 4"  (1.626 m)  Wt 108 lb (48.988 kg)  BMI 18.53 kg/m2        Assessment & Plan:  1.  Vitamin D deficiency - Vit D  25 hydroxy (rtn osteoporosis monitoring) - POCT CBC  2. Hyperlipemia - BMP8+EGFR - Hepatic function panel - NMR, lipoprofile - POCT CBC  3. URI (upper respiratory infection) - chlorpheniramine-HYDROcodone (TUSSIONEX PENNKINETIC ER) 10-8 MG/5ML LQCR; 1/2-1 teaspoon at bedtime if needed for severe cough  Dispense: 120 mL; Refill: 0  4. Allergic rhinitis due to pollen  5. Cough - chlorpheniramine-HYDROcodone (TUSSIONEX PENNKINETIC ER) 10-8 MG/5ML LQCR; 1/2-1 teaspoon at bedtime if needed for severe cough  Dispense: 120 mL; Refill: 0patin   Patient Instructions                       Medicare Annual Wellness Visit  [facility] and the medical providers at [facility] strive to bring you the best medical care.  In doing so we not only want to address your current medical conditions and concerns but also to detect new conditions early and prevent illness, disease and health-related problems.    Medicare offers a yearly Wellness Visit which allows our clinical staff to assess your need for preventative services including immunizations, lifestyle education, counseling to decrease risk of preventable diseases and screening for fall risk and other medical concerns.    This visit is provided free of charge (no copay) for all Medicare recipients. The clinical pharmacists at [facility] have begun to conduct these Wellness Visits which will also include a thorough review of all your medications.    As you primary medical provider recommend that you make an appointment for your Annual Wellness Visit if you have not done so already this year.  You may set up this appointment before you leave today or you may call back ([phone]) and schedule an appointment.  Please make sure when you call that you mention that you are scheduling your Annual Wellness Visit with the clinical pharmacist so that the appointment may be made for the  proper length of time.      Continue current medications. Continue good therapeutic lifestyle changes which include good diet and exercise. Fall precautions discussed with patient. If an FOBT was given today- please return it to our front desk. If you are over 80 years old - you may need Prevnar 27 or the adult Pneumonia vaccine.  Flu Shots will be available at our office starting mid- September. Please call and schedule a FLU CLINIC APPOINTMENT.    Drink plenty of fluids Take Mucinex maximum strength, blue and white in color, 1 twice daily with a large glass of water for cough and congestion Take Tylenol for aches pains and fever Use Flonase over-the-counter 1-2 sprays each nostril at bedtime Use saline nose spray frequently during the day We will call you with your lab work results once those results are available   Arrie Senate MD    .

## 2014-02-06 NOTE — Patient Instructions (Addendum)
Medicare Annual Wellness Visit  [facility] and the medical providers at [facility] strive to bring you the best medical care.  In doing so we not only want to address your current medical conditions and concerns but also to detect new conditions early and prevent illness, disease and health-related problems.    Medicare offers a yearly Wellness Visit which allows our clinical staff to assess your need for preventative services including immunizations, lifestyle education, counseling to decrease risk of preventable diseases and screening for fall risk and other medical concerns.    This visit is provided free of charge (no copay) for all Medicare recipients. The clinical pharmacists at [facility] have begun to conduct these Wellness Visits which will also include a thorough review of all your medications.    As you primary medical provider recommend that you make an appointment for your Annual Wellness Visit if you have not done so already this year.  You may set up this appointment before you leave today or you may call back [phone]) and schedule an appointment.  Please make sure when you call that you mention that you are scheduling your Annual Wellness Visit with the clinical pharmacist so that the appointment may be made for the proper length of time.      Continue current medications. Continue good therapeutic lifestyle changes which include good diet and exercise. Fall precautions discussed with patient. If an FOBT was given today- please return it to our front desk. If you are over 5 years old - you may need Prevnar 13 or the adult Pneumonia vaccine.  Flu Shots will be available at our office starting mid- September. Please call and schedule a FLU CLINIC APPOINTMENT.    Drink plenty of fluids Take Mucinex maximum strength, blue and white in color, 1 twice daily with a large glass of water for cough and  congestion Take Tylenol for aches pains and fever Use Flonase over-the-counter 1-2 sprays each nostril at bedtime Use saline nose spray frequently during the day We will call you with your lab work results once those results are available

## 2014-02-07 LAB — NMR, LIPOPROFILE
Cholesterol: 165 mg/dL (ref 100–199)
HDL CHOLESTEROL BY NMR: 70 mg/dL (ref >39)
HDL Particle Number: 36.4 umol/L (ref >=30.5)
LDL PARTICLE NUMBER: 960 nmol/L (ref <1000)
LDL Size: 21 nm (ref >20.5)
LDLC SERPL CALC-MCNC: 69 mg/dL (ref 0–99)
LP-IR Score: <25 (ref <=45)
TRIGLYCERIDES BY NMR: 131 mg/dL (ref 0–149)

## 2014-02-07 LAB — HEPATIC FUNCTION PANEL
ALT: 27 IU/L (ref 0–32)
AST: 33 IU/L (ref 0–40)
Albumin: 4.7 g/dL (ref 3.6–4.8)
Alkaline Phosphatase: 46 IU/L (ref 39–117)
BILIRUBIN TOTAL: 0.5 mg/dL (ref 0.0–1.2)
Bilirubin, Direct: 0.12 mg/dL (ref 0.00–0.40)
Total Protein: 7.1 g/dL (ref 6.0–8.5)

## 2014-02-07 LAB — BMP8+EGFR
BUN/Creatinine Ratio: 25 (ref 11–26)
BUN: 18 mg/dL (ref 8–27)
CALCIUM: 9.8 mg/dL (ref 8.7–10.3)
CO2: 25 mmol/L (ref 18–29)
CREATININE: 0.73 mg/dL (ref 0.57–1.00)
Chloride: 98 mmol/L (ref 97–108)
GFR calc Af Amer: 97 mL/min/{1.73_m2} (ref >59)
GFR calc non Af Amer: 84 mL/min/{1.73_m2} (ref >59)
Glucose: 89 mg/dL (ref 65–99)
Potassium: 4.3 mmol/L (ref 3.5–5.2)
Sodium: 140 mmol/L (ref 134–144)

## 2014-02-07 LAB — VITAMIN D 25 HYDROXY (VIT D DEFICIENCY, FRACTURES): VIT D 25 HYDROXY: 61.7 ng/mL (ref 30.0–100.0)

## 2014-02-21 ENCOUNTER — Ambulatory Visit: Payer: Medicare Other

## 2014-02-21 DIAGNOSIS — Z23 Encounter for immunization: Secondary | ICD-10-CM

## 2014-03-20 ENCOUNTER — Encounter: Payer: Self-pay | Admitting: Family Medicine

## 2014-05-10 ENCOUNTER — Ambulatory Visit (INDEPENDENT_AMBULATORY_CARE_PROVIDER_SITE_OTHER): Payer: Medicare Other | Admitting: *Deleted

## 2014-05-10 DIAGNOSIS — Z23 Encounter for immunization: Secondary | ICD-10-CM

## 2014-06-20 ENCOUNTER — Ambulatory Visit: Payer: Medicare Other | Admitting: Family Medicine

## 2014-06-22 ENCOUNTER — Ambulatory Visit: Payer: Medicare Other | Admitting: Family Medicine

## 2014-07-19 ENCOUNTER — Telehealth: Payer: Self-pay | Admitting: Pharmacist

## 2014-07-19 NOTE — Telephone Encounter (Signed)
Called patient remind that Reclast is due around 07/28/14.  [place] and [place] new [place] there.  She is in discussion with him about continuing Reclast and rechecking BMD.

## 2015-05-09 IMAGING — CR DG CHEST 2V
2 series · 2 of 2 positions shown · non-contrast
Comparison: None.

CLINICAL DATA: Health care maintenance, weight loss

EXAM:
CHEST  2 VIEW

[view not recorded (1 of 2)]
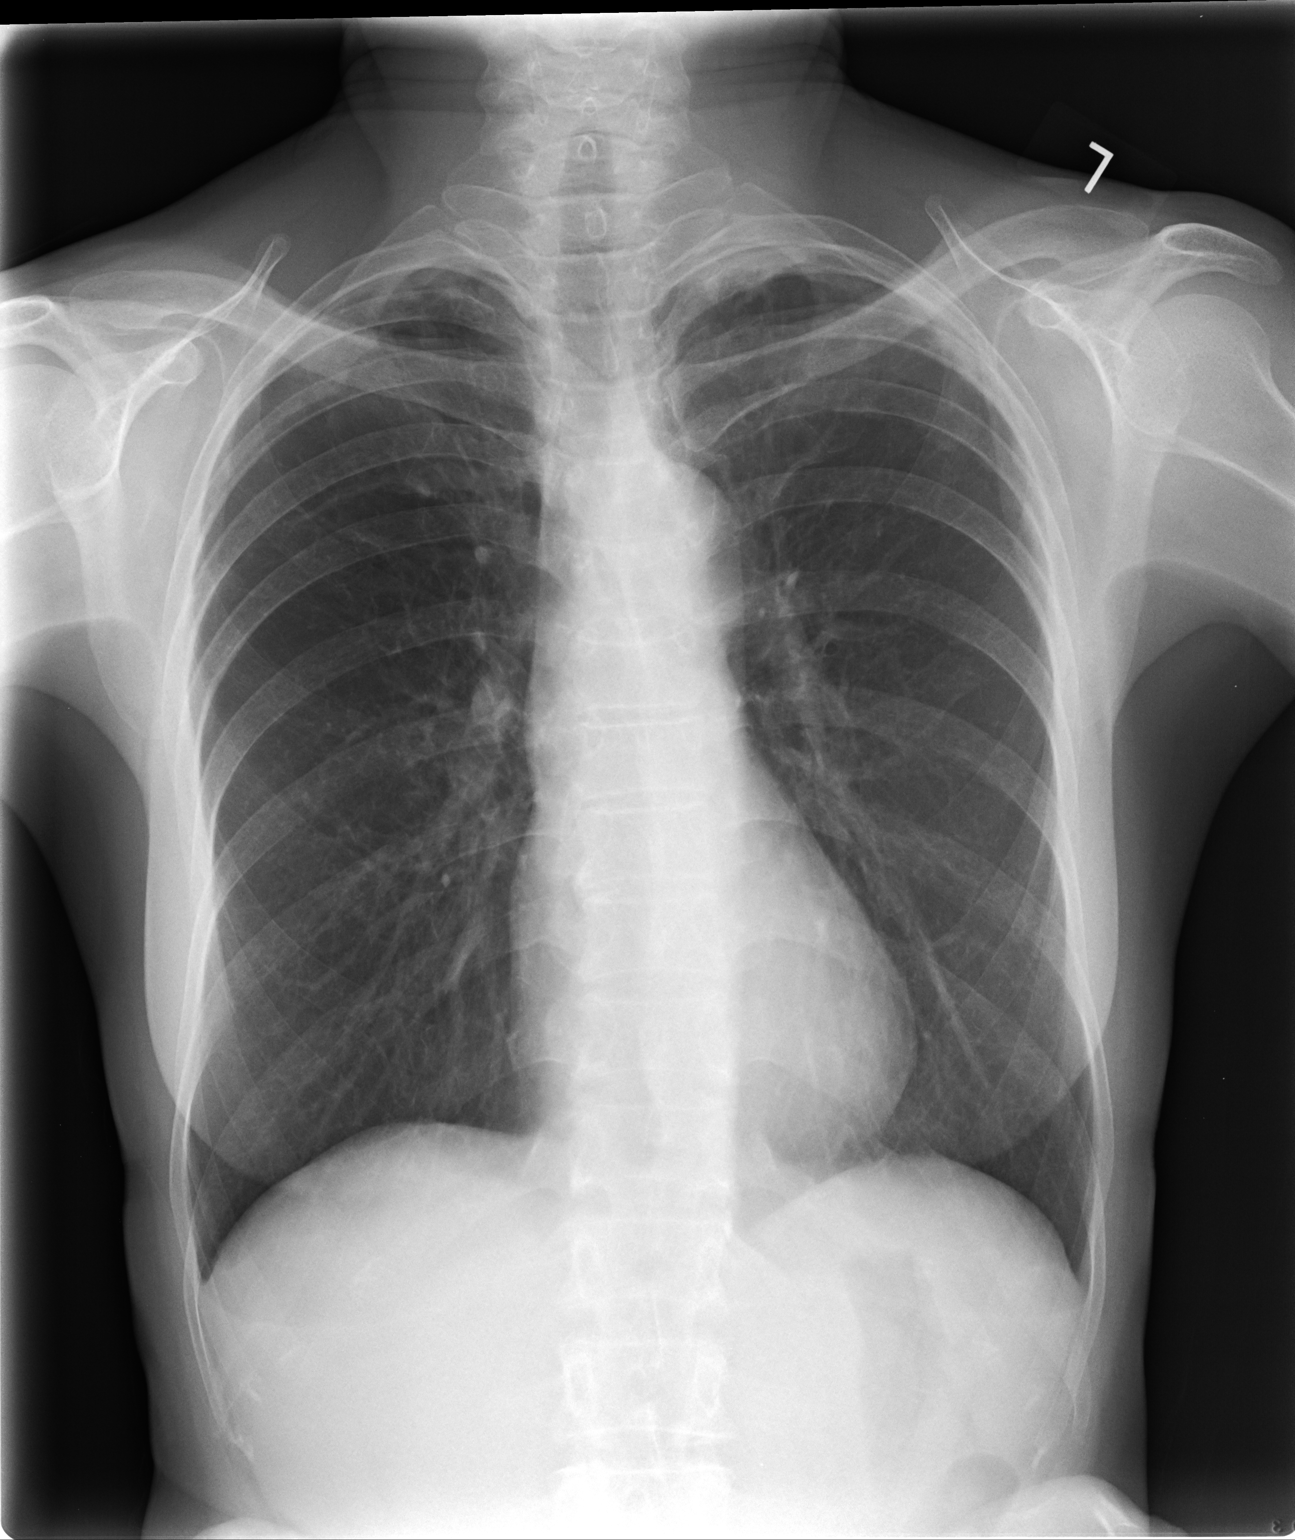

[view not recorded (2 of 2)]
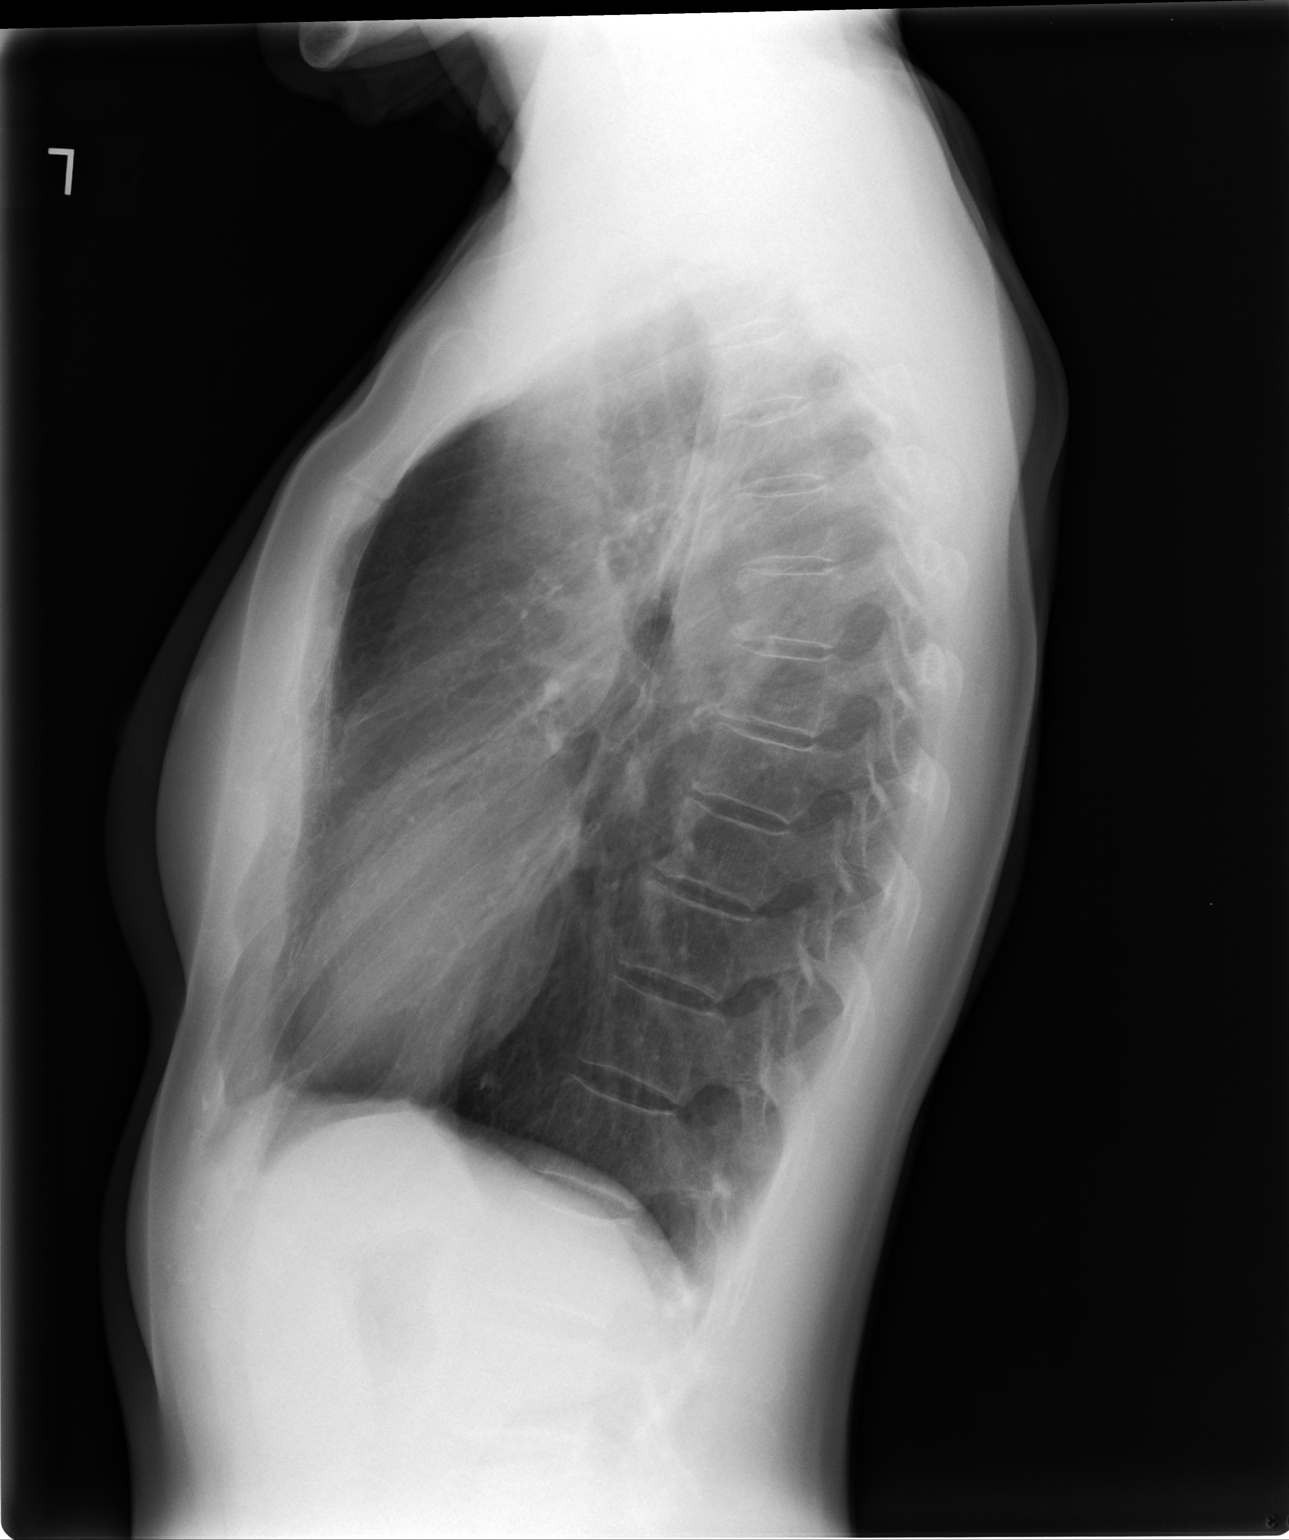

[2 of 2 positions shown; findings below may reference images not displayed]

FINDINGS: The heart size and vascular pattern are normal. Lungs are clear
except for mild bilateral pleural apical thickening which is likely
chronic. No consolidation or effusion.
IMPRESSION: No active cardiopulmonary disease.
